# Patient Record
Sex: Female | Born: 1965 | Race: White | Hispanic: No | Marital: Married | Smoking: Never smoker
Health system: Southern US, Community
[De-identification: ages and names within clinical notes are randomized; demographics above are authoritative.]

## PROBLEM LIST (undated history)

## (undated) DIAGNOSIS — K589 Irritable bowel syndrome without diarrhea: Secondary | ICD-10-CM

## (undated) DIAGNOSIS — R350 Frequency of micturition: Secondary | ICD-10-CM

## (undated) DIAGNOSIS — F32A Depression, unspecified: Secondary | ICD-10-CM

## (undated) DIAGNOSIS — I10 Essential (primary) hypertension: Secondary | ICD-10-CM

## (undated) DIAGNOSIS — F419 Anxiety disorder, unspecified: Secondary | ICD-10-CM

## (undated) HISTORY — PX: ABDOMINAL HYSTERECTOMY: SHX81

## (undated) HISTORY — DX: Frequency of micturition: R35.0

## (undated) HISTORY — PX: KNEE SURGERY: SHX244

## (undated) HISTORY — PX: GASTRIC BYPASS: SHX52

## (undated) HISTORY — DX: Depression, unspecified: F32.A

## (undated) HISTORY — PX: ROTATOR CUFF REPAIR: SHX139

## (undated) HISTORY — DX: Irritable bowel syndrome, unspecified: K58.9

## (undated) HISTORY — PX: BREAST SURGERY: SHX581

## (undated) HISTORY — DX: Anxiety disorder, unspecified: F41.9

---

## 2008-04-22 ENCOUNTER — Encounter: Admission: RE | Admit: 2008-04-22 | Discharge: 2008-04-22 | Payer: Self-pay | Admitting: Family Medicine

## 2010-05-03 IMAGING — CR DG RIBS W/ CHEST 3+V*R*
3 series · 3 of 3 positions shown · non-contrast
Comparison: None

CLINICAL DATA: T fell hitting right ribs with right chest pain,
cough and shortness of breath

RIGHT RIBS AND CHEST - 3+ VIEW

[view not recorded (1 of 3)]
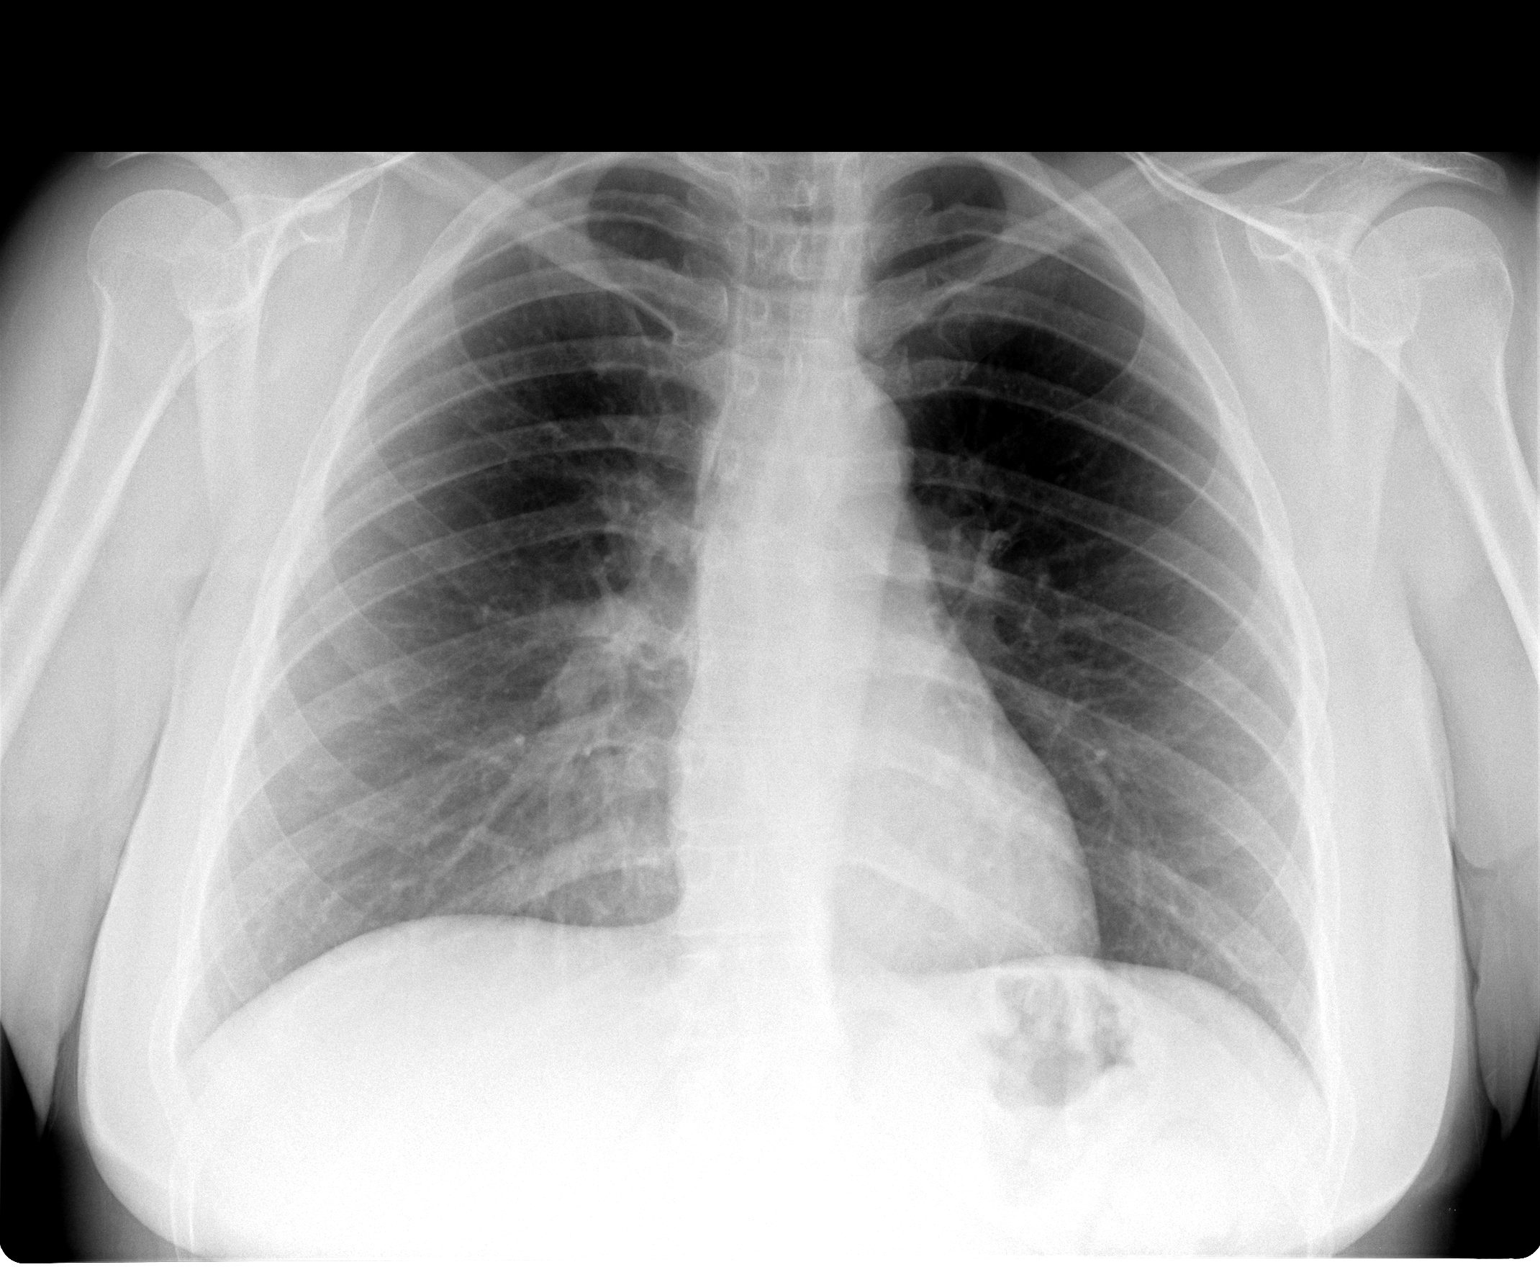

[view not recorded (2 of 3)]
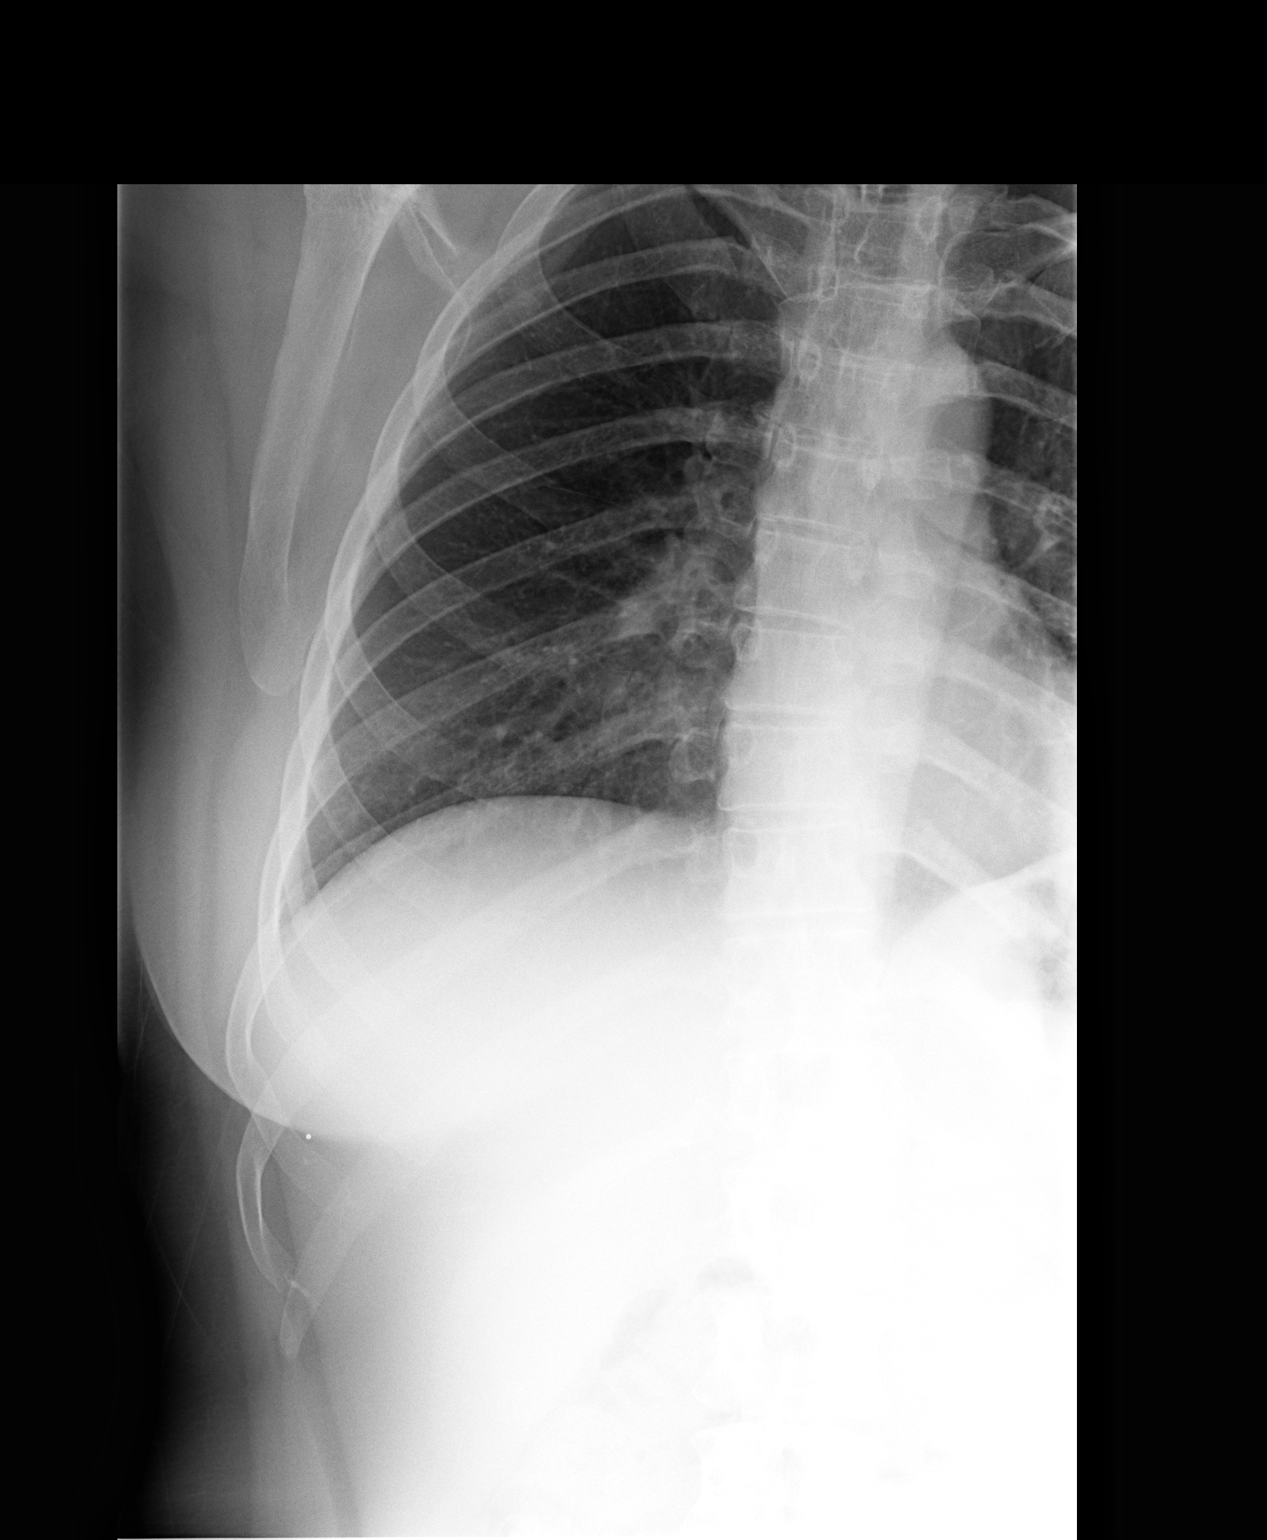

[view not recorded (3 of 3)]
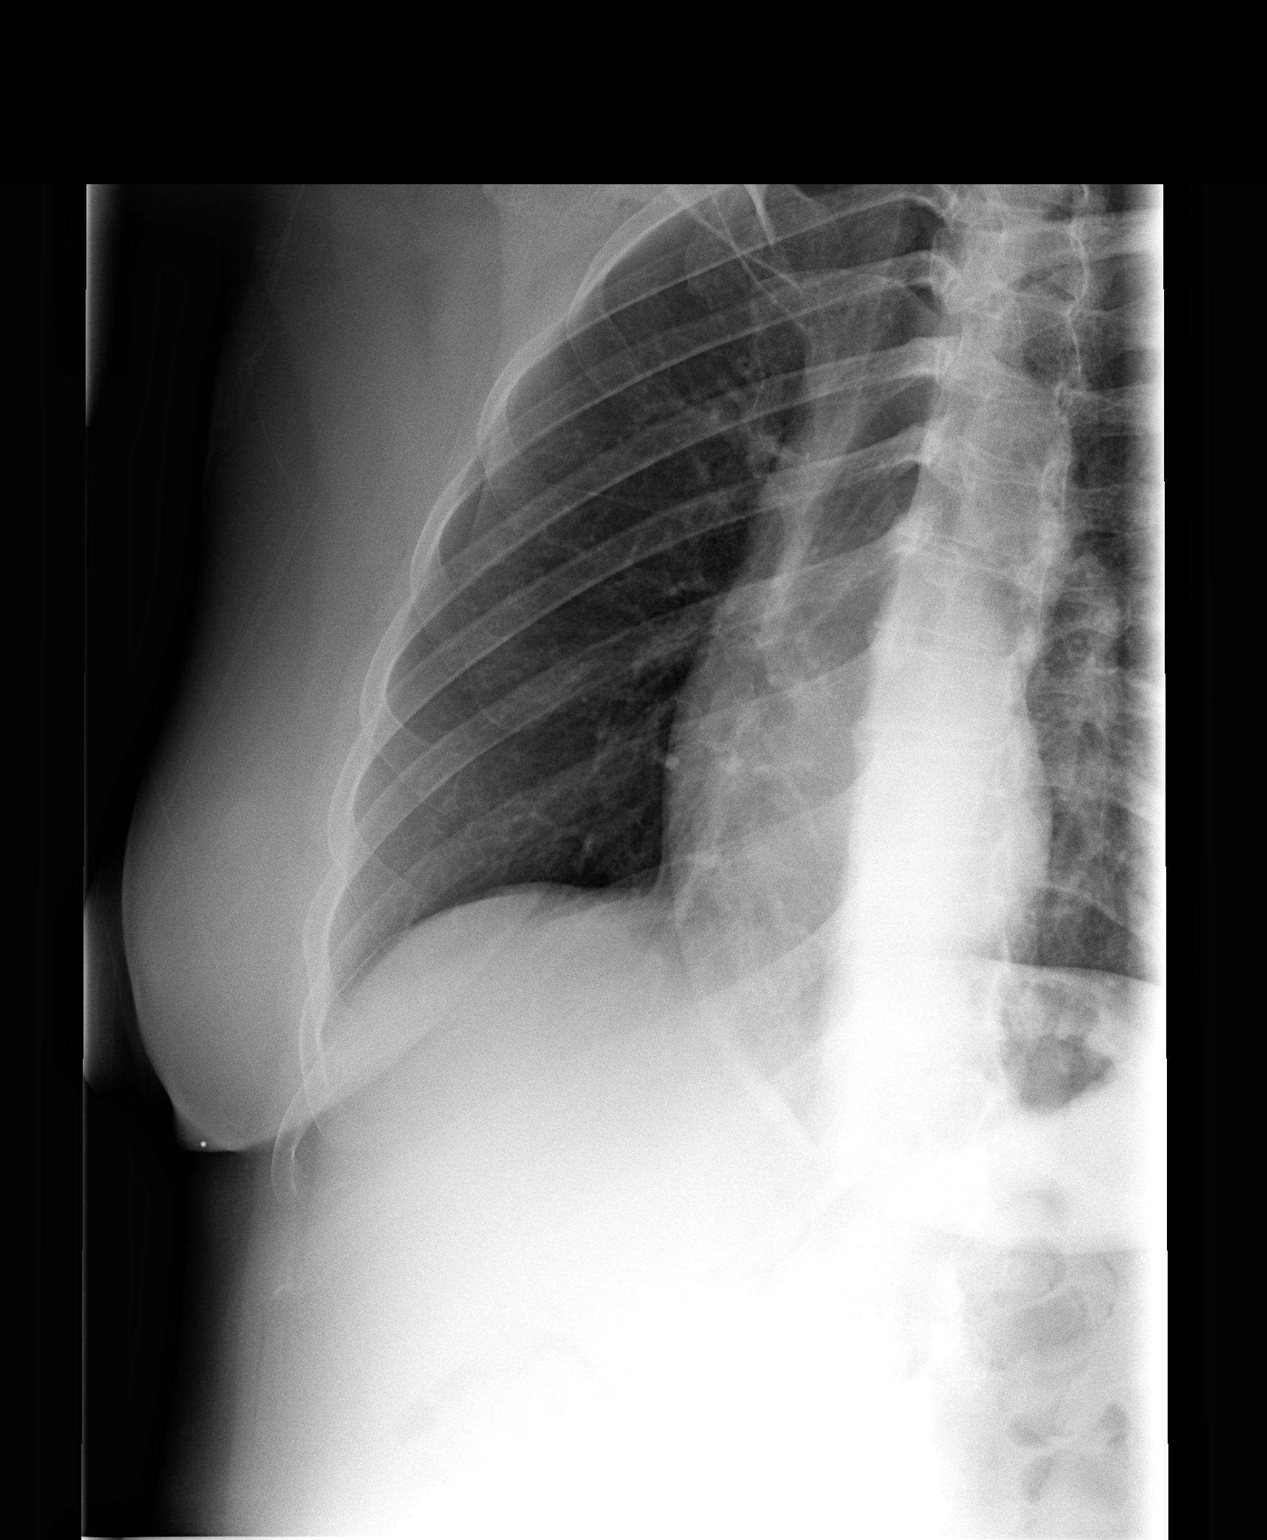

[3 of 3 positions shown; findings below may reference images not displayed]

FINDINGS: The lungs are clear.  The heart is within normal limits
in size.  Two right rib detail films show no acute right rib
fracture.
IMPRESSION: 1.  No active lung disease.
2.  Negative left rib detail.

## 2013-04-17 ENCOUNTER — Other Ambulatory Visit (HOSPITAL_COMMUNITY): Payer: Self-pay | Admitting: Family Medicine

## 2013-04-17 DIAGNOSIS — Z139 Encounter for screening, unspecified: Secondary | ICD-10-CM

## 2013-04-27 ENCOUNTER — Ambulatory Visit (HOSPITAL_COMMUNITY): Payer: Self-pay

## 2022-07-04 ENCOUNTER — Ambulatory Visit
Admission: EM | Admit: 2022-07-04 | Discharge: 2022-07-04 | Disposition: A | Discharge status: Home / Self Care | Payer: BC Managed Care – PPO | Attending: Family Medicine | Admitting: Family Medicine

## 2022-07-04 DIAGNOSIS — L03319 Cellulitis of trunk, unspecified: Secondary | ICD-10-CM

## 2022-07-04 DIAGNOSIS — L02219 Cutaneous abscess of trunk, unspecified: Secondary | ICD-10-CM | POA: Diagnosis not present

## 2022-07-04 HISTORY — DX: Essential (primary) hypertension: I10

## 2022-07-04 MED ORDER — CEPHALEXIN 500 MG PO CAPS: 500.0000 mg | ORAL_CAPSULE | Freq: Three times a day (TID) | ORAL | 0 refills | Status: DC

## 2022-07-04 MED ORDER — HYDROCODONE-ACETAMINOPHEN 5-325 MG PO TABS: 1.0000 | ORAL_TABLET | Freq: Four times a day (QID) | ORAL | 0 refills | Status: DC | PRN

## 2022-07-04 MED ORDER — DOXYCYCLINE HYCLATE 100 MG PO CAPS: 100.0000 mg | ORAL_CAPSULE | Freq: Two times a day (BID) | ORAL | 0 refills | Status: DC

## 2022-07-04 MED ORDER — CEFTRIAXONE SODIUM 1 G IJ SOLR
1000.0000 mg | Freq: Once | INTRAMUSCULAR | Status: AC
  Administered 2022-07-04: 1000 mg via INTRAMUSCULAR

## 2022-07-04 NOTE — Discharge Instructions (Addendum)
Take antibiotic Keflex 3 times a day Take Tylenol or ibuprofen for moderate pain Take hydrocodone as needed for severe pain Warm compresses to area Change dressing daily

## 2022-07-04 NOTE — ED Provider Notes (Signed)
[facility]    CSN: [id number] Arrival date & time: 07/04/22  1855      History   Chief Complaint Chief Complaint  Patient presents with   Cyst    On back    HPI Tracy Calderon is a 57 y.o. female.   HPI  Cyst on back for many years.  Over the last couple days its gotten more red and more swollen.  Patient states that s since 3:00 this afternoon it looks like it has doubled.  It is uncomfortable.  No fever or malaise  Past Medical History:  Diagnosis Date   Hypertension     There are no problems to display for this patient.   Past Surgical History:  Procedure Laterality Date   ABDOMINAL HYSTERECTOMY     BREAST SURGERY     GASTRIC BYPASS     03/2021   KNEE SURGERY     ROTATOR CUFF REPAIR      OB History   No obstetric history on file.      Home Medications    Prior to Admission medications   Medication Sig Start Date End Date Taking? Authorizing Provider  ALPRAZolam Duanne Moron) 0.5 MG tablet Take by mouth. 03/23/13  Yes [provider]  ARIPiprazole (ABILIFY) 5 MG tablet Take 1 tablet by mouth daily. 03/04/13  Yes [provider]  cephALEXin (KEFLEX) 500 MG capsule Take 1 capsule (500 mg total) by mouth 3 (three) times daily. 07/04/22  Yes Raylene Everts, MD  esomeprazole (NEXIUM) 40 MG capsule TAKE ONE CAPSULE BY MOUTH EVERY MORNING AND 1 CAPSULE AT BEDTIME 10/12/21  Yes [provider]  HYDROcodone-acetaminophen (NORCO/VICODIN) 5-325 MG tablet Take 1-2 tablets by mouth every 6 (six) hours as needed. 07/04/22  Yes Raylene Everts, MD  lamoTRIgine (LAMICTAL) 25 MG tablet Take by mouth. 06/21/22  Yes [provider]  levothyroxine (SYNTHROID) 125 MCG tablet TAKE 1 TABLET(125 MCG) BY MOUTH DAILY 01/12/22  Yes [provider]  lubiprostone (AMITIZA) 24 MCG capsule TAKE 1 CAPSULE(24 MCG) BY MOUTH TWICE DAILY WITH MEALS 06/14/22  Yes [provider]  potassium chloride SA (KLOR-CON M) 20 MEQ tablet TAKE 1  TABLET(20 MEQ) BY MOUTH TWICE DAILY 04/24/21  Yes [provider]  sertraline (ZOLOFT) 100 MG tablet Take 1 tablet by mouth daily. 12/19/17  Yes [provider]  solifenacin (VESICARE) 5 MG tablet TAKE 1 TABLET(5 MG) BY MOUTH DAILY 10/04/21  Yes [provider]  zolpidem (AMBIEN CR) 12.5 MG CR tablet Take by mouth. 05/18/20  Yes [provider]  traZODone (DESYREL) 100 MG tablet Take by mouth.    [provider]    Family History History reviewed. No pertinent family history.  Social History Social History   Tobacco Use   Smoking status: Never   Smokeless tobacco: Never  Vaping Use   Vaping Use: Every day  Substance Use Topics   Alcohol use: Not Currently     Allergies   Vortioxetine, Hydromorphone, and Sulfa antibiotics   Review of Systems Review of Systems See HPI  Physical Exam Triage Vital Signs ED Triage Vitals [07/04/22 1903]  Enc Vitals Group     BP (!) 154/95     Pulse Rate 96     Resp 17     Temp 99.5 F (37.5 C)     Temp Source Oral     SpO2 98 %     Weight      Height  Head Circumference      Peak Flow      Pain Score 5     Pain Loc      Pain Edu?      Excl. in [place]?    No data found.  Updated Vital Signs BP (!) 154/95 (BP Location: Right Arm)   Pulse 96   Temp 99.5 F (37.5 C) (Oral)   Resp 17   SpO2 98%      Physical Exam Constitutional:      General: She is not in acute distress.    Appearance: She is well-developed.  HENT:     Head: Normocephalic and atraumatic.  Eyes:     Conjunctiva/sclera: Conjunctivae normal.     Pupils: Pupils are equal, round, and reactive to light.  Cardiovascular:     Rate and Rhythm: Normal rate.  Pulmonary:     Effort: Pulmonary effort is normal. No respiratory distress.  Abdominal:     General: There is no distension.     Palpations: Abdomen is soft.  Musculoskeletal:        General: Normal range of motion.     Cervical back: Normal range of motion.   Skin:    General: Skin is warm and dry.  Neurological:     Mental Status: She is alert.    In the middle of the back at the shoulder blades there is an indurated area that measures 4 to 5 cm across.  It is tender.  Feels softer in the center. I cleaned with skin cleanser I anesthetized with a 1 7 gauge needle, 2% lidocaine, 2 cc With a 18-gauge needle I attempted aspiration.  Scant bloody purulence was retrieved With an 11 blade I made a one half incision and probed to recorders and deep. No purulence was identified Procedure was stopped  UC Treatments / Results  Labs (all labs ordered are listed, but only abnormal results are displayed) Labs Reviewed - No data to display  EKG   Radiology No results found.  Procedures Procedures (including critical care time)  Medications Ordered in UC Medications  cefTRIAXone (ROCEPHIN) injection 1,000 mg (has no administration in time range)    Initial Impression / Assessment and Plan / UC Course  I have reviewed the triage vital signs and the nursing notes.  Pertinent labs & imaging results that were available during my care of the patient were reviewed by me and considered in my medical decision making (see chart for details).     Patient has cellulitis.  Suspicion of abscess but not identified on procedure Final Clinical Impressions(s) / UC Diagnoses   Final diagnoses:  Cellulitis and abscess of trunk     Discharge Instructions      Take antibiotic Keflex 3 times a day Take Tylenol or ibuprofen for moderate pain Take hydrocodone as needed for severe pain Warm compresses to area Change dressing daily    ED Prescriptions     Medication Sig Dispense Auth. Provider   HYDROcodone-acetaminophen (NORCO/VICODIN) 5-325 MG tablet Take 1-2 tablets by mouth every 6 (six) hours as needed. 10 tablet Raylene Everts, MD   cephALEXin (KEFLEX) 500 MG capsule Take 1 capsule (500 mg total) by mouth 3 (three) times daily. 30  capsule Raylene Everts, MD      I have reviewed the PDMP during this encounter.   Raylene Everts, MD 07/04/22 [id number]

## 2022-07-04 NOTE — ED Triage Notes (Signed)
Pt c/o cyst on back since Friday. Started as "pea size bump". Worsening in last 12 hours. No drainage. Red and painful. Pain 5/10

## 2023-01-23 ENCOUNTER — Encounter: Payer: Self-pay | Admitting: Emergency Medicine

## 2023-01-23 ENCOUNTER — Ambulatory Visit
Admission: EM | Admit: 2023-01-23 | Discharge: 2023-01-23 | Disposition: A | Discharge status: Home / Self Care | Payer: BC Managed Care – PPO | Attending: Family Medicine | Admitting: Family Medicine

## 2023-01-23 DIAGNOSIS — Z20822 Contact with and (suspected) exposure to covid-19: Secondary | ICD-10-CM

## 2023-01-23 DIAGNOSIS — R0981 Nasal congestion: Secondary | ICD-10-CM | POA: Diagnosis not present

## 2023-01-23 DIAGNOSIS — U071 COVID-19: Secondary | ICD-10-CM | POA: Insufficient documentation

## 2023-01-23 MED ORDER — PREDNISONE 50 MG PO TABS: ORAL_TABLET | ORAL | 0 refills | Status: AC

## 2023-01-23 NOTE — Discharge Instructions (Addendum)
Advised patient to take medication as directed with food to completion.  Encouraged increase daily water intake to 64 ounces per day while taking these medications.  Advised we will follow-up tomorrow, Thursday, 01/24/2023 with COVID-19 results.  Advised if symptoms worsen and/or unresolved please follow-up with PCP or here for further evaluation.

## 2023-01-23 NOTE — ED Provider Notes (Signed)
[facility]    CSN: [id number] Arrival date & time: 01/23/23  1203      History   Chief Complaint Chief Complaint  Patient presents with   Exposure to COVID    HPI Tracy Calderon is a 57 y.o. female.   HPI 89-year-old female presents with dizzy spells, cough, fatigue since yesterday.  Patient reports 3 negative home COVID-19 test.  Reports that her husband tested positive for COVID-19 this weekend.  PMH significant for HTN, gastric bypass, and breast surgery.  Patient reports is a current every day vapor.  Past Medical History:  Diagnosis Date   Hypertension     There are no problems to display for this patient.   Past Surgical History:  Procedure Laterality Date   ABDOMINAL HYSTERECTOMY     BREAST SURGERY     GASTRIC BYPASS     03/2021   KNEE SURGERY     ROTATOR CUFF REPAIR      OB History   No obstetric history on file.      Home Medications    Prior to Admission medications   Medication Sig Start Date End Date Taking? Authorizing Provider  ALPRAZolam Prudy Feeler) 0.5 MG tablet Take by mouth. 03/23/13  Yes [provider]  ARIPiprazole (ABILIFY) 5 MG tablet Take 1 tablet by mouth daily. 03/04/13  Yes [provider]  esomeprazole (NEXIUM) 40 MG capsule TAKE ONE CAPSULE BY MOUTH EVERY MORNING AND 1 CAPSULE AT BEDTIME 10/12/21  Yes [provider]  lamoTRIgine (LAMICTAL) 25 MG tablet Take by mouth. 06/21/22  Yes [provider]  levothyroxine (SYNTHROID) 125 MCG tablet TAKE 1 TABLET(125 MCG) BY MOUTH DAILY 01/12/22  Yes [provider]  potassium chloride SA (KLOR-CON M) 20 MEQ tablet TAKE 1 TABLET(20 MEQ) BY MOUTH TWICE DAILY 04/24/21  Yes [provider]  predniSONE (DELTASONE) 50 MG tablet Take 1 tab p.o. daily for 5 days. 01/23/23  Yes Trevor Iha, FNP  sertraline (ZOLOFT) 100 MG tablet Take 1 tablet by mouth daily. 12/19/17  Yes [provider]  solifenacin (VESICARE) 5 MG tablet TAKE 1 TABLET(5  MG) BY MOUTH DAILY 10/04/21  Yes [provider]  traZODone (DESYREL) 100 MG tablet Take by mouth.   Yes [provider]  zolpidem (AMBIEN CR) 12.5 MG CR tablet Take by mouth. 05/18/20  Yes [provider]  lubiprostone (AMITIZA) 24 MCG capsule TAKE 1 CAPSULE(24 MCG) BY MOUTH TWICE DAILY WITH MEALS 06/14/22   [provider]    Family History History reviewed. No pertinent family history.  Social History Social History   Tobacco Use   Smoking status: Never   Smokeless tobacco: Never  Vaping Use   Vaping status: Every Day  Substance Use Topics   Alcohol use: Not Currently   Drug use: Never     Allergies   Vortioxetine, Hydromorphone, and Sulfa antibiotics   Review of Systems Review of Systems  Constitutional:  Positive for chills and fatigue.  Neurological:  Positive for dizziness.  All other systems reviewed and are negative.    Physical Exam Triage Vital Signs ED Triage Vitals  Encounter Vitals Group     BP 01/23/23 1217 116/77     Systolic BP Percentile --      Diastolic BP Percentile --      Pulse Rate 01/23/23 1217 81     Resp 01/23/23 1217 18     Temp 01/23/23 1217 98.7 F (37.1 C)     Temp Source 01/23/23 1217  Oral     SpO2 01/23/23 1217 97 %     Weight --      Height --      Head Circumference --      Peak Flow --      Pain Score 01/23/23 1218 8     Pain Loc --      Pain Education --      Exclude from Growth Chart --    No data found.  Updated Vital Signs BP 116/77 (BP Location: Right Arm)   Pulse 81   Temp 98.7 F (37.1 C) (Oral)   Resp 18   Ht 5\' 7"  (1.702 m)   Wt 140 lb (63.5 kg)   SpO2 97%   BMI 21.93 kg/m    Physical Exam Vitals and nursing note reviewed.  Constitutional:      Appearance: Normal appearance. She is normal weight.  HENT:     Head: Normocephalic and atraumatic.     Right Ear: Tympanic membrane, ear canal and external ear normal.     Left Ear: Tympanic membrane, ear canal and  external ear normal.     Mouth/Throat:     Mouth: Mucous membranes are moist.     Pharynx: Oropharynx is clear.  Eyes:     Extraocular Movements: Extraocular movements intact.     Conjunctiva/sclera: Conjunctivae normal.     Pupils: Pupils are equal, round, and reactive to light.  Cardiovascular:     Rate and Rhythm: Normal rate and regular rhythm.     Pulses: Normal pulses.     Heart sounds: Normal heart sounds.  Pulmonary:     Effort: Pulmonary effort is normal.     Breath sounds: Normal breath sounds. No wheezing, rhonchi or rales.  Musculoskeletal:        General: Normal range of motion.     Cervical back: Normal range of motion and neck supple.  Skin:    General: Skin is warm and dry.  Neurological:     General: No focal deficit present.     Mental Status: She is alert and oriented to person, place, and time. Mental status is at baseline.  Psychiatric:        Mood and Affect: Mood normal.        Behavior: Behavior normal.      UC Treatments / Results  Labs (all labs ordered are listed, but only abnormal results are displayed) Labs Reviewed  SARS CORONAVIRUS 2 (TAT 6-24 HRS)    EKG   Radiology No results found.  Procedures Procedures (including critical care time)  Medications Ordered in UC Medications - No data to display  Initial Impression / Assessment and Plan / UC Course  I have reviewed the triage vital signs and the nursing notes.  Pertinent labs & imaging results that were available during my care of the patient were reviewed by me and considered in my medical decision making (see chart for details).     MDM: 1.  Exposure to COVID-19 virus-SARS coronavirus 2 (TAT 6-24 hours) ordered; 2.  Congestion of nasal sinus-Rx'd prednisone 50 mg tablet once daily x 5 days. Advised patient to take medication as directed with food to completion.  Encouraged increase daily water intake to 64 ounces per day while taking these medications.  Advised we will  follow-up tomorrow, Thursday, 01/24/2023 with COVID-19 results.  Advised if symptoms worsen and/or unresolved please follow-up with PCP or here for further evaluation.  Patient discharged home, hemodynamically stable. Final Clinical Impressions(s) /  UC Diagnoses   Final diagnoses:  Exposure to COVID-19 virus  Congestion of nasal sinus     Discharge Instructions      Advised patient to take medication as directed with food to completion.  Encouraged increase daily water intake to 64 ounces per day while taking these medications.  Advised we will follow-up tomorrow, Thursday, 01/24/2023 with COVID-19 results.  Advised if symptoms worsen and/or unresolved please follow-up with PCP or here for further evaluation.     ED Prescriptions     Medication Sig Dispense Auth. Provider   predniSONE (DELTASONE) 50 MG tablet Take 1 tab p.o. daily for 5 days. 5 tablet Trevor Iha, FNP      PDMP not reviewed this encounter.   Trevor Iha, FNP 01/23/23 1301

## 2023-01-23 NOTE — ED Triage Notes (Signed)
Patient states that her husband tested positive for COVID this weekend.  Patient began having dizzy spells, cough, fatigue since yesterday.  Patient has tested 3 x for COVID, all negative.  Patient has taken Alka Seltzer Cold and Tylenol.

## 2023-01-24 LAB — SARS CORONAVIRUS 2 (TAT 6-24 HRS): SARS Coronavirus 2: POSITIVE — AB

## 2023-01-25 ENCOUNTER — Telehealth: Payer: Self-pay

## 2023-01-25 ENCOUNTER — Telehealth: Payer: Self-pay | Admitting: Physician Assistant

## 2023-01-25 MED ORDER — PAXLOVID (300/100) 20 X 150 MG & 10 X 100MG PO TBPK: 3.0000 | ORAL_TABLET | Freq: Two times a day (BID) | ORAL | 0 refills | Status: AC

## 2023-01-25 NOTE — Telephone Encounter (Signed)
Rx for paxlovid per pt request

## 2023-01-25 NOTE — Telephone Encounter (Signed)
Returned patient's phone call wanting paxlovid, will forward to provider

## 2024-04-10 ENCOUNTER — Ambulatory Visit: Admission: EM | Admit: 2024-04-10 | Discharge: 2024-04-10 | Disposition: A | Discharge status: Home / Self Care

## 2024-04-10 ENCOUNTER — Encounter (HOSPITAL_BASED_OUTPATIENT_CLINIC_OR_DEPARTMENT_OTHER): Payer: Self-pay

## 2024-04-10 ENCOUNTER — Other Ambulatory Visit: Payer: Self-pay

## 2024-04-10 ENCOUNTER — Emergency Department (HOSPITAL_BASED_OUTPATIENT_CLINIC_OR_DEPARTMENT_OTHER)
Admission: EM | Admit: 2024-04-10 | Discharge: 2024-04-10 | Disposition: A | Discharge status: Home / Self Care | Attending: Emergency Medicine | Admitting: Emergency Medicine

## 2024-04-10 ENCOUNTER — Emergency Department (HOSPITAL_BASED_OUTPATIENT_CLINIC_OR_DEPARTMENT_OTHER)

## 2024-04-10 DIAGNOSIS — R350 Frequency of micturition: Secondary | ICD-10-CM | POA: Diagnosis not present

## 2024-04-10 DIAGNOSIS — N390 Urinary tract infection, site not specified: Secondary | ICD-10-CM | POA: Insufficient documentation

## 2024-04-10 DIAGNOSIS — R39198 Other difficulties with micturition: Secondary | ICD-10-CM

## 2024-04-10 DIAGNOSIS — R300 Dysuria: Secondary | ICD-10-CM | POA: Diagnosis present

## 2024-04-10 LAB — CBC
HCT: 38.3 % (ref 36.0–46.0)
Hemoglobin: 12.6 g/dL (ref 12.0–15.0)
MCH: 31.5 pg (ref 26.0–34.0)
MCHC: 32.9 g/dL (ref 30.0–36.0)
MCV: 95.8 fL (ref 80.0–100.0)
Platelets: 211 K/uL (ref 150–400)
RBC: 4 MIL/uL (ref 3.87–5.11)
RDW: 13.2 % (ref 11.5–15.5)
WBC: 5.6 K/uL (ref 4.0–10.5)
nRBC: 0 % (ref 0.0–0.2)

## 2024-04-10 LAB — URINALYSIS, ROUTINE W REFLEX MICROSCOPIC
Bilirubin Urine: NEGATIVE
Glucose, UA: NEGATIVE mg/dL
Hgb urine dipstick: NEGATIVE
Ketones, ur: NEGATIVE mg/dL
Nitrite: POSITIVE — AB
Protein, ur: NEGATIVE mg/dL
Specific Gravity, Urine: 1.025 (ref 1.005–1.030)
pH: 5.5 (ref 5.0–8.0)

## 2024-04-10 LAB — BASIC METABOLIC PANEL WITH GFR
Anion gap: 10 (ref 5–15)
BUN: 14 mg/dL (ref 6–20)
CO2: 25 mmol/L (ref 22–32)
Calcium: 9 mg/dL (ref 8.9–10.3)
Chloride: 105 mmol/L (ref 98–111)
Creatinine, Ser: 0.66 mg/dL (ref 0.44–1.00)
GFR, Estimated: >60 mL/min (ref >60)
Glucose, Bld: 91 mg/dL (ref 70–99)
Potassium: 4 mmol/L (ref 3.5–5.1)
Sodium: 139 mmol/L (ref 135–145)

## 2024-04-10 LAB — URINALYSIS, MICROSCOPIC (REFLEX): RBC / HPF: NONE SEEN RBC/hpf (ref 0–5)

## 2024-04-10 MED ORDER — FLUCONAZOLE 150 MG PO TABS: 150.0000 mg | ORAL_TABLET | Freq: Once | ORAL | 0 refills | Status: AC

## 2024-04-10 MED ORDER — CEPHALEXIN 500 MG PO CAPS: 500.0000 mg | ORAL_CAPSULE | Freq: Four times a day (QID) | ORAL | 0 refills | Status: DC

## 2024-04-10 MED ORDER — CEPHALEXIN 500 MG PO CAPS: 500.0000 mg | ORAL_CAPSULE | Freq: Four times a day (QID) | ORAL | 0 refills | Status: AC

## 2024-04-10 MED ORDER — FLUCONAZOLE 150 MG PO TABS: 150.0000 mg | ORAL_TABLET | Freq: Once | ORAL | 0 refills | Status: DC

## 2024-04-10 NOTE — ED Notes (Signed)
 Pharmacy changed over per pt request.   Discharge paperwork reviewed entirely with patient, including follow up care. Pain was under control. The patient received instruction and coaching on their prescriptions, and all follow-up questions were answered.  Pt verbalized understanding as well as all parties involved. No questions or concerns voiced at the time of discharge. No acute distress noted. Pt was encouraged to stay adequately hydrated and eat a healthy diet.   Pt ambulated out to PVA without incident or assistance.  Pt advised they will seek followup care with a specialist and followup with their PCP.   The pt was instructed to set up and/or review MyChart for their results; and was informed their Providers all have access to the information as well.

## 2024-04-10 NOTE — ED Notes (Signed)
 PT here for recurrent UTI.

## 2024-04-10 NOTE — ED Provider Notes (Signed)
 [facility]    CSN: [id number] Arrival date & time: 04/10/24  9073      History   Chief Complaint Chief Complaint  Patient presents with   Dysuria    HPI Tracy Calderon is a 58 y.o. female.   Patient presents to urgent care with concerns of urinary frequency and difficulty voiding.  Patient reports that she developed urinary urgency and frequency about 5 days ago.  She went to her PCP on 11/25 and was prescribed Macrobid for concern for urinary tract infection.  She states that urine culture is still pending so she called her PCP today who told her to come to urgent care for evaluation.  Patient reports that Macrobid has not improved her symptoms and she is having difficulty urinating.  If she is able to urinate, she can only produce approximately 1 teaspoon of urine.  Denies hematuria.  She is having back pain across her back with the left side being more severe.  Denies fever, chills, body aches.  Reports that she does have frequent urinary tract infections where she has been evaluated by urology in the past.  Denies history of kidney stones.   Dysuria   Past Medical History:  Diagnosis Date   Anxiety    Depression    Frequent urination    Hypertension    IBS (irritable bowel syndrome)     There are no active problems to display for this patient.   Past Surgical History:  Procedure Laterality Date   ABDOMINAL HYSTERECTOMY     BREAST SURGERY     GASTRIC BYPASS     03/2021   KNEE SURGERY     ROTATOR CUFF REPAIR      OB History   No obstetric history on file.      Home Medications    Prior to Admission medications   Medication Sig Start Date End Date Taking? Authorizing Provider  ALPRAZolam (XANAX) 0.5 MG tablet Take by mouth. 03/23/13   [provider]  ARIPiprazole (ABILIFY) 5 MG tablet Take 1 tablet by mouth daily. 03/04/13   [provider]  esomeprazole (NEXIUM) 40 MG capsule TAKE ONE CAPSULE BY MOUTH EVERY MORNING AND 1  CAPSULE AT BEDTIME 10/12/21   [provider]  lamoTRIgine (LAMICTAL) 25 MG tablet Take by mouth. 06/21/22   [provider]  levothyroxine (SYNTHROID) 125 MCG tablet TAKE 1 TABLET(125 MCG) BY MOUTH DAILY 01/12/22   [provider]  lubiprostone (AMITIZA) 24 MCG capsule TAKE 1 CAPSULE(24 MCG) BY MOUTH TWICE DAILY WITH MEALS 06/14/22   [provider]  potassium chloride SA (KLOR-CON M) 20 MEQ tablet TAKE 1 TABLET(20 MEQ) BY MOUTH TWICE DAILY 04/24/21   [provider]  predniSONE  (DELTASONE ) 50 MG tablet Take 1 tab p.o. daily for 5 days. 01/23/23   Ragan, Michael, FNP  sertraline (ZOLOFT) 100 MG tablet Take 1 tablet by mouth daily. 12/19/17   [provider]  solifenacin (VESICARE) 5 MG tablet TAKE 1 TABLET(5 MG) BY MOUTH DAILY 10/04/21   [provider]  traZODone (DESYREL) 100 MG tablet Take by mouth.    [provider]  zolpidem (AMBIEN CR) 12.5 MG CR tablet Take by mouth. 05/18/20   [provider]    Family History History reviewed. No pertinent family history.  Social History Social History   Tobacco Use   Smoking status: Never   Smokeless tobacco: Never  Vaping Use   Vaping status: Every Day  Substance Use Topics   Alcohol  use: Yes   Drug use: Never     Allergies   Vortioxetine, Hydromorphone, and Sulfa antibiotics   Review of Systems Review of Systems Per HPI  Physical Exam Triage Vital Signs ED Triage Vitals  Encounter Vitals Group     BP 04/10/24 1000 110/74     Girls Systolic BP Percentile --      Girls Diastolic BP Percentile --      Boys Systolic BP Percentile --      Boys Diastolic BP Percentile --      Pulse Rate 04/10/24 0958 93     Resp 04/10/24 0958 16     Temp 04/10/24 0958 98.1 F (36.7 C)     Temp src --      SpO2 04/10/24 1000 97 %     Weight --      Height --      Head Circumference --      Peak Flow --      Pain Score 04/10/24 1001 8     Pain Loc --      Pain Education  --      Exclude from Growth Chart --    No data found.  Updated Vital Signs BP 110/74   Pulse 93   Temp 98.1 F (36.7 C)   Resp 16   SpO2 97%   Visual Acuity Right Eye Distance:   Left Eye Distance:   Bilateral Distance:    Right Eye Near:   Left Eye Near:    Bilateral Near:     Physical Exam Constitutional:      General: She is not in acute distress.    Appearance: Normal appearance. She is not toxic-appearing or diaphoretic.  HENT:     Head: Normocephalic and atraumatic.  Eyes:     Extraocular Movements: Extraocular movements intact.     Conjunctiva/sclera: Conjunctivae normal.  Pulmonary:     Effort: Pulmonary effort is normal.  Abdominal:     General: Bowel sounds are normal. There is no distension.     Palpations: Abdomen is soft.     Tenderness: There is no abdominal tenderness.  Musculoskeletal:       Back:     Comments: Tenderness to palpation to left flank.  Neurological:     General: No focal deficit present.     Mental Status: She is alert and oriented to person, place, and time. Mental status is at baseline.  Psychiatric:        Mood and Affect: Mood normal.        Behavior: Behavior normal.        Thought Content: Thought content normal.        Judgment: Judgment normal.      UC Treatments / Results  Labs (all labs ordered are listed, but only abnormal results are displayed) Labs Reviewed - No data to display  EKG   Radiology No results found.  Procedures Procedures (including critical care time)  Medications Ordered in UC Medications - No data to display  Initial Impression / Assessment and Plan / UC Course  I have reviewed the triage vital signs and the nursing notes.  Pertinent labs & imaging results that were available during my care of the patient were reviewed by me and considered in my medical decision making (see chart for details).     Given patient's decrease in urination, recommended further evaluation at the  emergency department given limited resources here at urgent care as she may need STAT  blood work and CT imaging.  Patient was agreeable with plan.  Vital signs stable at discharge.  Agree with patient self transport to the ER. Final Clinical Impressions(s) / UC Diagnoses   Final diagnoses:  Urinary frequency  Voiding difficulty   Discharge Instructions   None    ED Prescriptions   None    PDMP not reviewed this encounter.   Hazen Darryle BRAVO, [place] 04/10/24 1018

## 2024-04-10 NOTE — ED Triage Notes (Addendum)
 Since Monday has had urinary urgency, frequency, now reports unable to urinate. Mid lower back pain. Reports has been on macrobid for 4 days, was prescribed by Dr. Nanci at [facility] family [facility] (seen Monday). Felt feverish Tuesday. Reports was told to come to ucc d/t not being able to pass urine. Reports only about a teaspoon is coming out. Has had azo.

## 2024-04-10 NOTE — ED Triage Notes (Signed)
 Pt reports dysuria, frequency & urgency x 6 days. On Macrobid X 4 for UTI with no improvement in symptoms. Now reports being unable to urinate & lower back pain since this am.

## 2024-04-10 NOTE — ED Provider Notes (Signed)
 [place] [facility] AT [facility] Provider Note   CSN: [id number] Arrival date & time: 04/10/24  1105     Patient presents with: Dysuria   Tracy Calderon is a 58 y.o. female.    Dysuria  Patient presents with low back pain dysuria.  History of recurrent UTIs.  Currently on Macrobid.  Has had around 5 or 6 days of symptoms.  Reviewed notes appears had a dipstick done but no culture.  Reviewing previous cultures had E. coli that was overall pretty sensitive.  No fevers.  Seen in urgent care and sent here.  No fevers.  States has not gotten better however.  States also feels as if decreased urination.    Prior to Admission medications   Medication Sig Start Date End Date Taking? Authorizing Provider  cephALEXin  (KEFLEX ) 500 MG capsule Take 1 capsule (500 mg total) by mouth 4 (four) times daily. 04/10/24  Yes Patsey Lot, MD  fluconazole (DIFLUCAN) 150 MG tablet Take 1 tablet (150 mg total) by mouth once for 1 dose. Take after completing antibiotics 04/10/24 04/10/24 Yes Patsey Lot, MD  ALPRAZolam (XANAX) 0.5 MG tablet Take by mouth. 03/23/13   [provider]  ARIPiprazole (ABILIFY) 5 MG tablet Take 1 tablet by mouth daily. 03/04/13   [provider]  esomeprazole (NEXIUM) 40 MG capsule TAKE ONE CAPSULE BY MOUTH EVERY MORNING AND 1 CAPSULE AT BEDTIME 10/12/21   [provider]  lamoTRIgine (LAMICTAL) 25 MG tablet Take by mouth. 06/21/22   [provider]  levothyroxine (SYNTHROID) 125 MCG tablet TAKE 1 TABLET(125 MCG) BY MOUTH DAILY 01/12/22   [provider]  lubiprostone (AMITIZA) 24 MCG capsule TAKE 1 CAPSULE(24 MCG) BY MOUTH TWICE DAILY WITH MEALS 06/14/22   [provider]  potassium chloride SA (KLOR-CON M) 20 MEQ tablet TAKE 1 TABLET(20 MEQ) BY MOUTH TWICE DAILY 04/24/21   [provider]  predniSONE  (DELTASONE ) 50 MG tablet Take 1 tab p.o. daily for 5 days. 01/23/23   Ragan, Michael, FNP   sertraline (ZOLOFT) 100 MG tablet Take 1 tablet by mouth daily. 12/19/17   [provider]  solifenacin (VESICARE) 5 MG tablet TAKE 1 TABLET(5 MG) BY MOUTH DAILY 10/04/21   [provider]  traZODone (DESYREL) 100 MG tablet Take by mouth.    [provider]  zolpidem (AMBIEN CR) 12.5 MG CR tablet Take by mouth. 05/18/20   [provider]    Allergies: Vortioxetine, Hydromorphone, and Sulfa antibiotics    Review of Systems  Genitourinary:  Positive for dysuria.    Updated Vital Signs BP 139/80 (BP Location: Right Arm)   Pulse 66   Temp 98.1 F (36.7 C) (Oral)   Resp 16   SpO2 97%   Physical Exam Vitals and nursing note reviewed.  Abdominal:     Tenderness: There is no abdominal tenderness.  Genitourinary:    Comments: No CVA tenderness. Musculoskeletal:        General: Tenderness present.     Comments: CVA tenderness on left.  Neurological:     Mental Status: She is alert.     (all labs ordered are listed, but only abnormal results are displayed) Labs Reviewed  URINALYSIS, ROUTINE W REFLEX MICROSCOPIC - Abnormal; Notable for the following components:      Result Value   Nitrite POSITIVE (*)    Leukocytes,Ua SMALL (*)    All other components within normal limits  URINALYSIS, MICROSCOPIC (REFLEX) - Abnormal; Notable for the following components:  Bacteria, UA FEW (*)    All other components within normal limits  URINE CULTURE  BASIC METABOLIC PANEL WITH GFR  CBC    EKG: None  Radiology: CT Renal Stone Study Result Date: 04/10/2024 CLINICAL DATA:  Six day history of dysuria, frequency, and urgency. New lower back pain and inability to urinate EXAM: CT ABDOMEN AND PELVIS WITHOUT CONTRAST TECHNIQUE: Multidetector CT imaging of the abdomen and pelvis was performed following the standard protocol without IV contrast. RADIATION DOSE REDUCTION: This exam was performed according to the departmental dose-optimization program which includes  automated exposure control, adjustment of the mA and/or kV according to patient size and/or use of iterative reconstruction technique. COMPARISON:  None Available. FINDINGS: Lower chest: No focal consolidation or pulmonary nodule in the lung bases. No pleural effusion or pneumothorax demonstrated. Partially imaged heart size is normal. Hepatobiliary: No focal hepatic lesions. No intra or extrahepatic biliary ductal dilation. Cholecystectomy. Pancreas: No focal lesions or main ductal dilation. Spleen: Normal in size without focal abnormality. Adrenals/Urinary Tract: No adrenal nodules. No suspicious renal mass, calculi or hydronephrosis. Fullness of the left renal pelvis with luminal narrowing at the level of the ureteropelvic junction, which may suggest a degree of ureteropelvic junction obstruction. Underdistended urinary bladder. Stomach/Bowel: Postsurgical changes of Roux-en-Y gastric bypass. No evidence of bowel wall thickening, distention, or inflammatory changes. Colonic diverticulosis without acute diverticulitis. Appendix is not discretely seen. Vascular/Lymphatic: Aortic atherosclerosis. No enlarged abdominal or pelvic lymph nodes. Reproductive: Soft tissue density inseparable from the anterior left psoas muscle (2:52) containing a coarse focus of calcification, likely the left ovary. No right adnexal mass. Other: No free fluid, fluid collection, or free air. Musculoskeletal: No acute or abnormal lytic or blastic osseous lesions. IMPRESSION: 1. No acute abdominopelvic findings. No renal calculi. Urinary bladder is underdistended. 2. Fullness of the left renal pelvis with luminal narrowing at the level of the ureteropelvic junction, which may suggest a degree of ureteropelvic junction obstruction. 3. Colonic diverticulosis without acute diverticulitis. 4.  Aortic Atherosclerosis (ICD10-I70.0). Electronically Signed   By: Limin  Xu M.D.   On: 04/10/2024 12:33     Procedures   Medications Ordered in the  ED - No data to display                                  Medical Decision Making Amount and/or Complexity of Data Reviewed Labs: ordered. Radiology: ordered.  Risk Prescription drug management.   Patient with low back pain dysuria.  Recent UTI.  Reviewed PCP note reviewed urgent care note.  Reviewed previous cultures.  With CVA tenderness and urine showing potential infection will get CT scan to evaluate for stones.  Blood work reassuring.  States she was able to urinate almost normally.  Did have very small postvoid residual.  CT scan does show some mild prominence of the collecting system in the left renal pelvis.  However no stone.  No clear obstruction or infection.  Will treat with antibiotics.  Will switch to Keflex  due to previous cultures, however culture was sent here also.  Discharge home follow-up with PCP.    Final diagnoses:  Lower urinary tract infectious disease    ED Discharge Orders          Ordered    cephALEXin  (KEFLEX ) 500 MG capsule  4 times daily        04/10/24 1316    fluconazole (DIFLUCAN) 150 MG tablet  Once        04/10/24 1316               Patsey Lot, MD 04/10/24 1318

## 2024-04-12 LAB — URINE CULTURE: Culture: >=100000 — AB

## 2024-04-13 ENCOUNTER — Telehealth (HOSPITAL_BASED_OUTPATIENT_CLINIC_OR_DEPARTMENT_OTHER): Payer: Self-pay | Admitting: *Deleted

## 2024-04-13 NOTE — Telephone Encounter (Signed)
 Post ED Visit - Positive Culture Follow-up  Culture report reviewed by antimicrobial stewardship pharmacist: [facility] [x]  [place], [place].D. []  Venetia Gully, Pharm.D., BCPS AQ-ID []  Garrel Crews, Pharm.D., BCPS []  Almarie Lunger, Pharm.D., BCPS []  [place], [address].D., BCPS, AAHIVP []  Rosaline Bihari, Pharm.D., BCPS, AAHIVP []  Vernell Meier, PharmD, BCPS []  Latanya Hint, PharmD, BCPS []  Donald Medley, PharmD, BCPS []  Rocky Bold, PharmD []  Dorothyann Alert, PharmD, BCPS []  Morene Babe, PharmD  [facility] []  Rosaline Edison, PharmD []  Romona Bliss, PharmD []  Dolphus Roller, PharmD []  Veva Seip, Rph []  Vernell Daunt) Leonce, PharmD []  Eva Allis, PharmD []  Rosaline Millet, PharmD []  Iantha Batch, PharmD []  Arvin Gauss, PharmD []  Wanda Hasting, PharmD []  Ronal Rav, PharmD []  Rocky Slade, PharmD []  Bard Jeans, PharmD   Positive urine culture Treated with cephalexin , organism sensitive to the same and no further patient follow-up is required at this time.  Lorita Barnie Pereyra 04/13/2024, 2:55 PM
